# Patient Record
Sex: Female | Born: 1965 | Race: Black or African American | Hispanic: No | Marital: Married | Smoking: Never smoker
Health system: Southern US, Community
[De-identification: ages and names within clinical notes are randomized; demographics above are authoritative.]

## PROBLEM LIST (undated history)

## (undated) DIAGNOSIS — I10 Essential (primary) hypertension: Secondary | ICD-10-CM

## (undated) DIAGNOSIS — R87619 Unspecified abnormal cytological findings in specimens from cervix uteri: Secondary | ICD-10-CM

## (undated) DIAGNOSIS — B977 Papillomavirus as the cause of diseases classified elsewhere: Secondary | ICD-10-CM

## (undated) HISTORY — DX: Papillomavirus as the cause of diseases classified elsewhere: B97.7

## (undated) HISTORY — DX: Essential (primary) hypertension: I10

## (undated) HISTORY — DX: Unspecified abnormal cytological findings in specimens from cervix uteri: R87.619

## (undated) HISTORY — PX: COLPOSCOPY: SHX161

---

## 2005-10-05 ENCOUNTER — Ambulatory Visit: Payer: Self-pay | Admitting: Unknown Physician Specialty

## 2005-10-23 ENCOUNTER — Ambulatory Visit: Payer: Self-pay | Admitting: Unknown Physician Specialty

## 2008-01-01 ENCOUNTER — Ambulatory Visit: Payer: Self-pay | Admitting: Family Medicine

## 2011-03-03 ENCOUNTER — Emergency Department: Payer: Self-pay

## 2014-06-29 ENCOUNTER — Emergency Department: Payer: Self-pay | Admitting: Emergency Medicine

## 2016-03-16 ENCOUNTER — Other Ambulatory Visit: Payer: Self-pay | Admitting: Physician Assistant

## 2016-03-16 DIAGNOSIS — Z1231 Encounter for screening mammogram for malignant neoplasm of breast: Secondary | ICD-10-CM

## 2016-04-10 ENCOUNTER — Ambulatory Visit
Admission: RE | Admit: 2016-04-10 | Discharge: 2016-04-10 | Disposition: A | Discharge status: Home / Self Care | Payer: BLUE CROSS/BLUE SHIELD | Source: Ambulatory Visit | Attending: Physician Assistant | Admitting: Physician Assistant

## 2016-04-10 ENCOUNTER — Encounter: Payer: Self-pay | Admitting: Radiology

## 2016-04-10 DIAGNOSIS — Z1231 Encounter for screening mammogram for malignant neoplasm of breast: Secondary | ICD-10-CM | POA: Insufficient documentation

## 2016-11-06 ENCOUNTER — Encounter: Payer: Self-pay | Admitting: Obstetrics and Gynecology

## 2016-11-06 ENCOUNTER — Ambulatory Visit (INDEPENDENT_AMBULATORY_CARE_PROVIDER_SITE_OTHER): Payer: BLUE CROSS/BLUE SHIELD | Admitting: Obstetrics and Gynecology

## 2016-11-06 VITALS — BP 132/82 | HR 91 | Ht 59.0 in | Wt 214.0 lb

## 2016-11-06 DIAGNOSIS — R87810 Cervical high risk human papillomavirus (HPV) DNA test positive: Secondary | ICD-10-CM | POA: Diagnosis not present

## 2016-11-06 DIAGNOSIS — R87610 Atypical squamous cells of undetermined significance on cytologic smear of cervix (ASC-US): Secondary | ICD-10-CM | POA: Diagnosis not present

## 2016-11-06 NOTE — Patient Instructions (Signed)

## 2016-11-06 NOTE — Progress Notes (Signed)
GYNECOLOGY CLINIC COLPOSCOPY PROCEDURE NOTE  51 y.o. No obstetric history on file. here for colposcopy for ASCUS HPV positive pap smear on 08/2016. Discussed underlying role for HPV infection in the development of cervical dysplasia, its natural history and progression/regression, need for surveillance.  Is the patient  pregnant: No LMP: Patient's last menstrual period was 10/23/2016 (exact date). Smoking status:  Metrics: Intervention Frequency ACO  Documented Smoking Status Yearly  Screened one or more times in 24 months  Cessation Counseling or  Active cessation medication Past 24 months  Past 24 months   Guideline developer: UpToDate (See UpToDate for funding source) Date Released: 2014   Contraception: ParaGard IUD Future fertility desired:  No.  Patient given informed consent, signed copy in the chart, time out was performed.  The patient was position in dorsal lithotomy position. Speculum was placed the cervix was visualized.   After application of acetic acid colposcopic inspection of the cervix was undertaken.   Colposcopy adequate, full visualization of transformation zone: Yes no visible lesions; corresponding biopsies obtained.   ECC specimen obtained:  Yes All specimens were labeled and sent to pathology.   Patient was given post procedure instructions.  Will follow up pathology and manage accordingly.  Routine preventative health maintenance measures emphasized.  Physical Exam  Genitourinary:      Vena Austria, MD, [facility], [facility]

## 2016-11-08 LAB — PATHOLOGY

## 2016-11-09 ENCOUNTER — Telehealth: Payer: Self-pay | Admitting: Obstetrics and Gynecology

## 2016-11-20 NOTE — Telephone Encounter (Signed)
Pt called to get her Colpo results. Please advise. Thanks TNP

## 2016-11-20 NOTE — Telephone Encounter (Signed)
Please call patient with results.

## 2016-11-20 NOTE — Telephone Encounter (Signed)
Pt is calling back about her lab result.

## 2018-05-01 ENCOUNTER — Other Ambulatory Visit: Payer: Self-pay | Admitting: Family Medicine

## 2018-05-01 DIAGNOSIS — Z1231 Encounter for screening mammogram for malignant neoplasm of breast: Secondary | ICD-10-CM

## 2018-07-08 ENCOUNTER — Ambulatory Visit
Admission: RE | Admit: 2018-07-08 | Discharge: 2018-07-08 | Disposition: A | Discharge status: Home / Self Care | Payer: BLUE CROSS/BLUE SHIELD | Source: Ambulatory Visit | Attending: Family Medicine | Admitting: Family Medicine

## 2018-07-08 DIAGNOSIS — Z1231 Encounter for screening mammogram for malignant neoplasm of breast: Secondary | ICD-10-CM

## 2018-07-23 ENCOUNTER — Telehealth: Payer: Self-pay | Admitting: Obstetrics & Gynecology

## 2018-07-23 NOTE — Telephone Encounter (Signed)
[facility] for Colposcopy HPV+. Called and left voicemail for patient to call back to be schedule

## 2018-08-19 ENCOUNTER — Other Ambulatory Visit (HOSPITAL_COMMUNITY)
Admission: RE | Admit: 2018-08-19 | Discharge: 2018-08-19 | Disposition: A | Discharge status: Home / Self Care | Payer: BLUE CROSS/BLUE SHIELD | Source: Ambulatory Visit | Attending: Obstetrics and Gynecology | Admitting: Obstetrics and Gynecology

## 2018-08-19 ENCOUNTER — Encounter: Payer: Self-pay | Admitting: Obstetrics and Gynecology

## 2018-08-19 ENCOUNTER — Ambulatory Visit (INDEPENDENT_AMBULATORY_CARE_PROVIDER_SITE_OTHER): Payer: BLUE CROSS/BLUE SHIELD | Admitting: Obstetrics and Gynecology

## 2018-08-19 VITALS — BP 128/88 | Ht 59.0 in | Wt 221.0 lb

## 2018-08-19 DIAGNOSIS — R8789 Other abnormal findings in specimens from female genital organs: Secondary | ICD-10-CM

## 2018-08-19 DIAGNOSIS — N870 Mild cervical dysplasia: Secondary | ICD-10-CM

## 2018-08-19 DIAGNOSIS — R87810 Cervical high risk human papillomavirus (HPV) DNA test positive: Secondary | ICD-10-CM | POA: Insufficient documentation

## 2018-08-19 DIAGNOSIS — R87610 Atypical squamous cells of undetermined significance on cytologic smear of cervix (ASC-US): Secondary | ICD-10-CM | POA: Diagnosis present

## 2018-08-19 DIAGNOSIS — R87618 Other abnormal cytological findings on specimens from cervix uteri: Secondary | ICD-10-CM | POA: Insufficient documentation

## 2018-08-19 NOTE — Progress Notes (Signed)
Referring Provider:  Rexene Alberts, MD, [facility] for abnormal pap smear with HPV positive.  This is a persistent issue  HPI:  Annette Gill is a 53 y.o.  [phone]  who presents today for evaluation and management of abnormal cervical cytology.    Dysplasia History:   2018: ASCUS- HPV positive 2018: Colposcopy - normal 07/2018: NILM, HPV positive  OB History  Gravida Para Term Preterm AB Living  3 3 3     3   SAB TAB Ectopic Multiple Live Births          3    # Outcome Date GA Lbr Len/2nd Weight Sex Delivery Anes PTL Lv  3 Term           2 Term           1 Term             Past Medical History:  Diagnosis Date  . Abnormal Pap smear of cervix   . Human papilloma virus   . Hypertension     Past Surgical History:  Procedure Laterality Date  . COLPOSCOPY      SOCIAL HISTORY:  Social History   Substance and Sexual Activity  Alcohol Use Yes   Substance and Sexual Activity  Alcohol Use Yes     Substance and Sexual Activity  Drug Use No     History reviewed. No pertinent family history.  ALLERGIES:  Patient has no known allergies.  Current Outpatient Medications on File Prior to Visit  Medication Sig Dispense Refill  . levonorgestrel (MIRENA) 20 MCG/24HR IUD 1 each by Intrauterine route once.    Marland Kitchen lisinopril (PRINIVIL,ZESTRIL) 5 MG tablet   4   No current facility-administered medications on file prior to visit.     Physical Exam: -Vitals:  BP 128/88   Ht 4\' 11"  (1.499 m)   Wt 221 lb (100.2 kg)   LMP 07/28/2018   BMI 44.64 kg/m   GEN: WD, WN, NAD.  A+ O x 3, good mood and affect. ABD:  NT, ND.  Soft, no masses.  No hernias noted.   Pelvic:   Vulva: Normal appearance.  No lesions.  Vagina: No lesions or abnormalities noted.  Support: Normal pelvic support.  Urethra No masses tenderness or scarring.  Meatus Normal size without lesions or prolapse.  Cervix: See below.  Anus: Normal exam.  No lesions.  Perineum: Normal  exam.  No lesions.        Bimanual   Uterus: Normal size.  Non-tender.  Mobile.  AV.  Adnexae: No masses.  Non-tender to palpation.  Cul-de-sac: Negative for abnormality.   PROCEDURE: 1.  Urine Pregnancy Test:  not done 2.  Colposcopy performed with 4% acetic acid after verbal consent obtained                                         -Aceto-white Lesions Location(s): mildly diffusely               -Biopsy performed at 6 and 12 o'clock               -ECC indicated and performed: Yes.       -Biopsy sites made hemostatic with pressure, AgNO3, and/or Monsel's solution   -Satisfactory colposcopy: No.    -Evidence of Invasive cervical CA :  NO   -note: IUD strings present  and not interrupted during procedure  ASSESSMENT:  Annette Gill is a 53 y.o. [phone] here for  1. Pap smear abnormality of cervix/human papillomavirus (HPV) positive   2. ASCUS with positive high risk HPV cervical   .  PLAN:  I discussed the grading system of pap smears and HPV high risk viral types.  We will discuss and base management after colpo results return.      [name], MD  [facility], [facility] 08/19/2018  9:12 AM   CC: Antonieta Loveless, MD [address] [place], [place] [address]

## 2018-08-29 ENCOUNTER — Telehealth: Payer: Self-pay | Admitting: Obstetrics and Gynecology

## 2018-08-29 NOTE — Telephone Encounter (Signed)
Attempted to call.  Unable to leave VM.

## 2018-09-01 NOTE — Telephone Encounter (Signed)
Pt returned call 08/29/2018 at 5:47pm

## 2018-09-04 ENCOUNTER — Encounter: Payer: Self-pay | Admitting: Obstetrics and Gynecology

## 2018-09-04 NOTE — Telephone Encounter (Signed)
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 Recommendations per [facility] are to repeat pap smear with HPV in one year. I let her know that she could accomplish this at her PCP. However, if her results are abnormal again, we would have to repeat the procedure until she gets normal results.  All questions answered.   [name], MD, [facility], [facility] 09/04/2018 3:43 PM

## 2019-06-23 ENCOUNTER — Other Ambulatory Visit: Payer: Self-pay

## 2019-06-23 DIAGNOSIS — Z20822 Contact with and (suspected) exposure to covid-19: Secondary | ICD-10-CM

## 2019-06-24 LAB — NOVEL CORONAVIRUS, NAA: SARS-CoV-2, NAA: DETECTED — AB

## 2019-06-28 ENCOUNTER — Telehealth: Payer: Self-pay

## 2019-06-28 NOTE — Telephone Encounter (Signed)
Pt given Covid-19 positive results. Discussed mild, moderate and severe symptoms. Advised pt to call 911 for any respiratory issues and/dehydration. Discussed non test criteria for ending self isolation. Pt advised of way to manage symptoms at home and review isolation precautions especially the importance of washing hands frequently and wearing a mask when around others. Pt verbalized understanding.  

## 2019-11-05 ENCOUNTER — Other Ambulatory Visit: Payer: Self-pay | Admitting: Student

## 2019-11-05 DIAGNOSIS — Z1231 Encounter for screening mammogram for malignant neoplasm of breast: Secondary | ICD-10-CM

## 2019-11-17 ENCOUNTER — Ambulatory Visit
Admission: RE | Admit: 2019-11-17 | Discharge: 2019-11-17 | Disposition: A | Discharge status: Home / Self Care | Payer: BC Managed Care – PPO | Source: Ambulatory Visit | Attending: Obstetrics and Gynecology | Admitting: Obstetrics and Gynecology

## 2019-11-17 DIAGNOSIS — Z1231 Encounter for screening mammogram for malignant neoplasm of breast: Secondary | ICD-10-CM | POA: Diagnosis present

## 2020-10-04 IMAGING — MG DIGITAL SCREENING BILATERAL MAMMOGRAM WITH TOMO AND CAD
6 of 10 series · 6 of 30 positions shown · non-contrast
Comparison: Previous exam(s).

CLINICAL DATA: Screening.

EXAM:
DIGITAL SCREENING BILATERAL MAMMOGRAM WITH TOMO AND CAD

[R CC synth-2D]
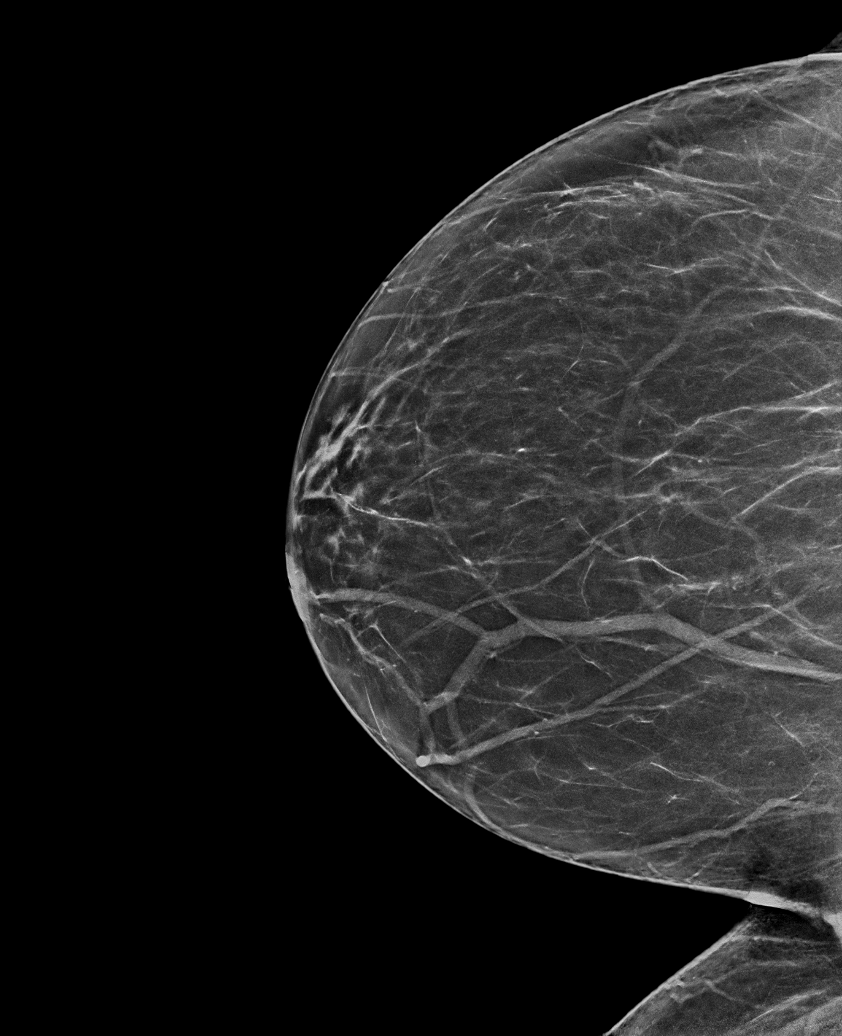

[R MLO synth-2D (1 of 2)]
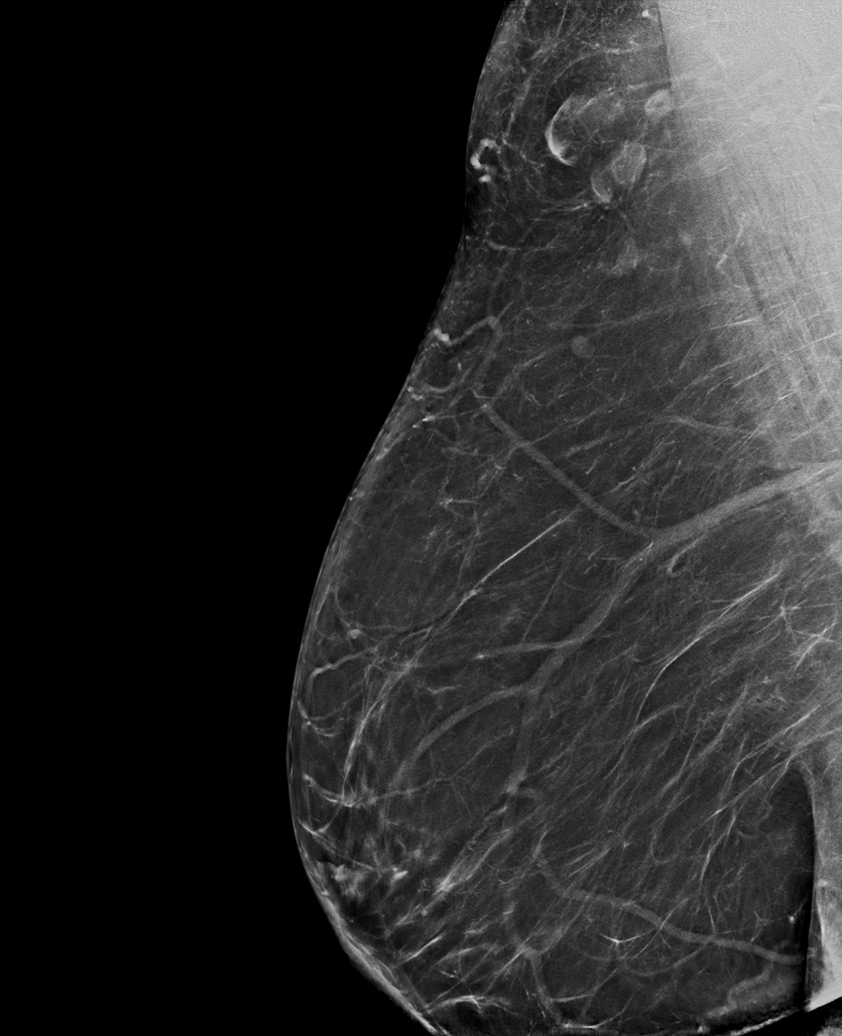

[L CC synth-2D]
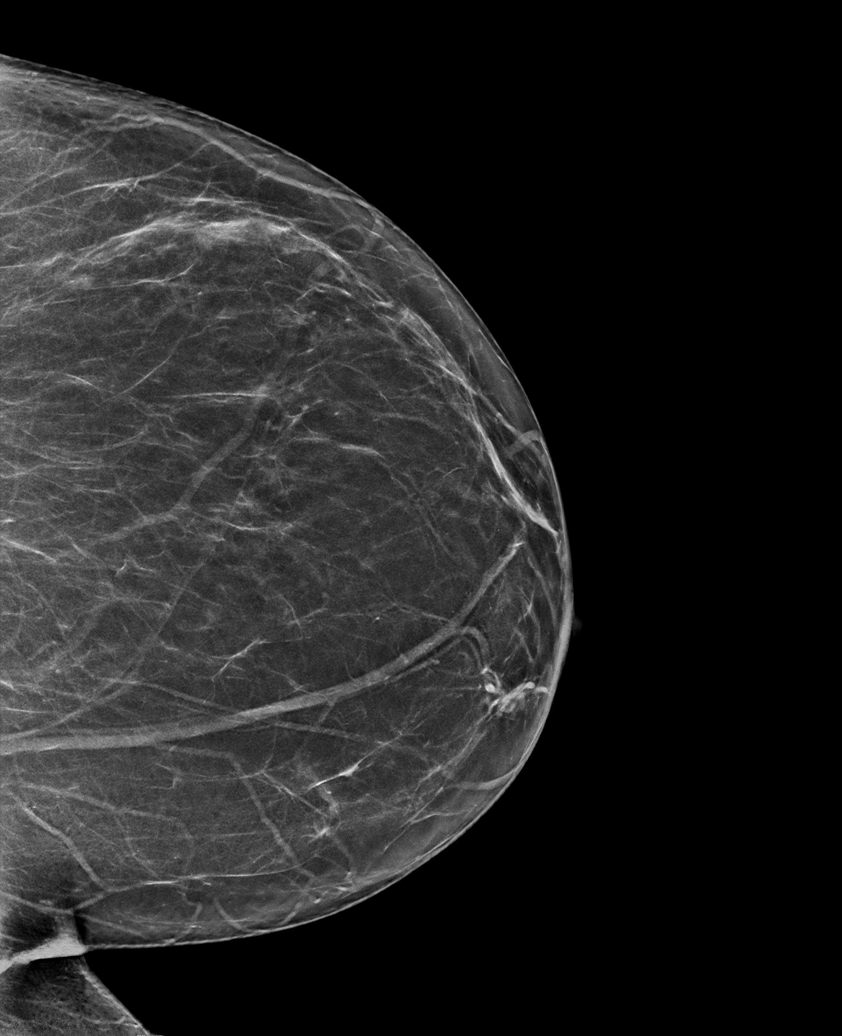

[L MLO synth-2D]
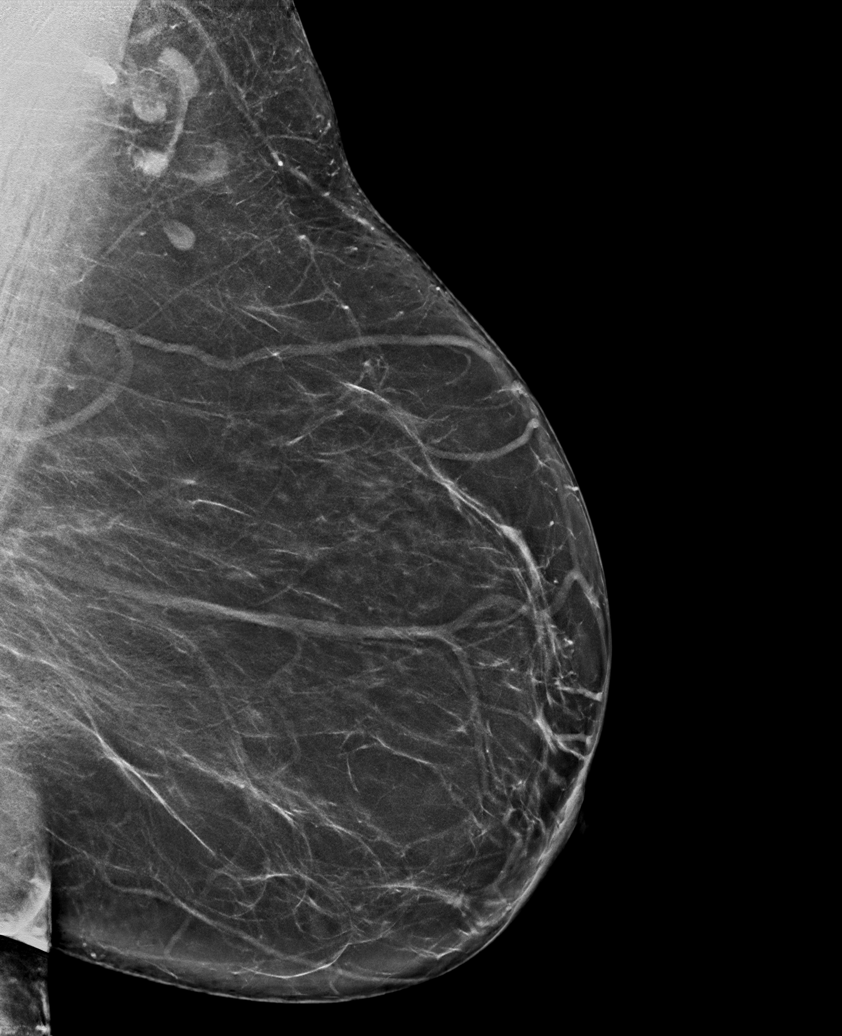

[R MLO synth-2D (2 of 2)]
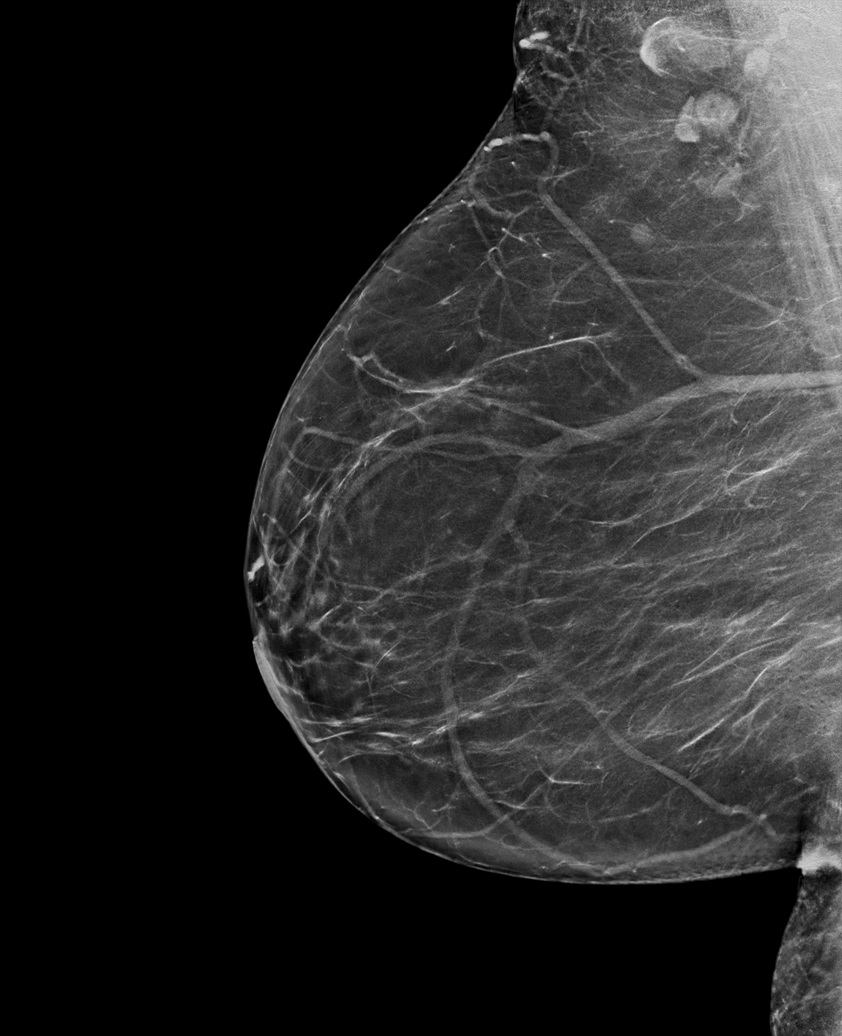

[L MLO tomo · tomo slice 41/82.0]
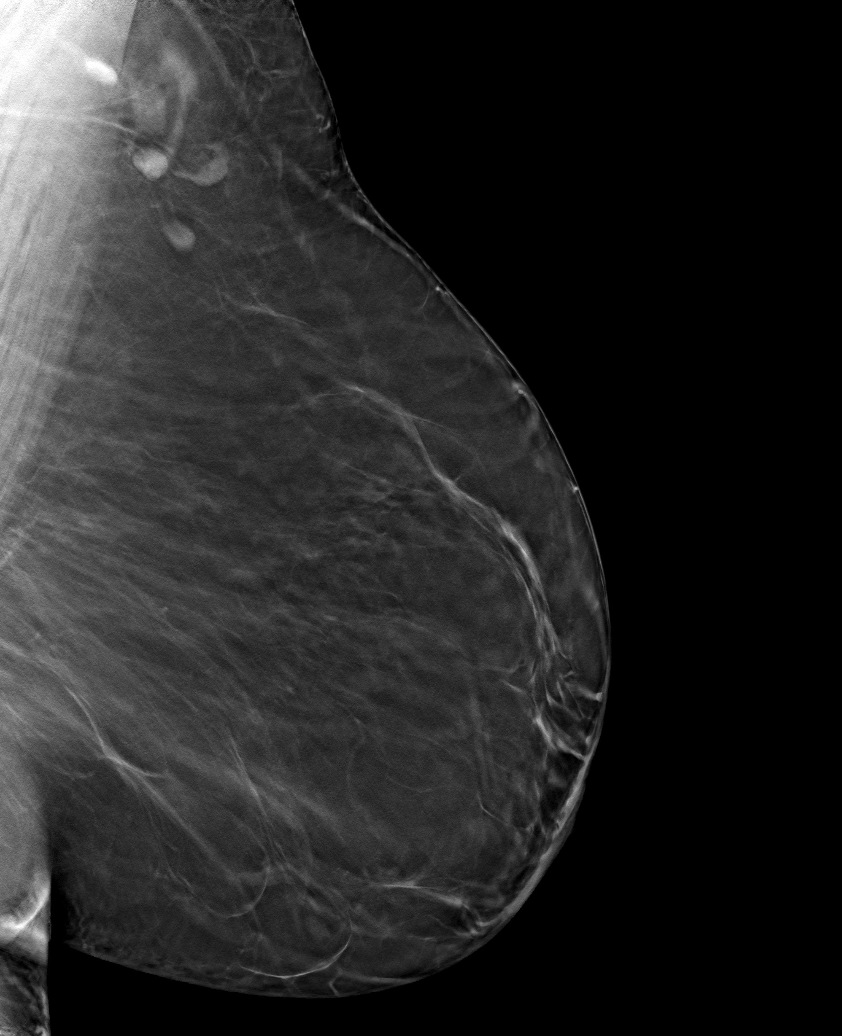

[6 of 30 positions shown; findings below may reference images not displayed]

ACR Breast Density Category b: There are scattered areas of
fibroglandular density.
FINDINGS: There are no findings suspicious for malignancy. Images were
processed with CAD.
IMPRESSION: No mammographic evidence of malignancy. A result letter of this
screening mammogram will be mailed directly to the patient.

RECOMMENDATION:
Screening mammogram in one year. (Code:[id number])

BI-RADS CATEGORY  1: Negative.

## 2022-04-13 ENCOUNTER — Other Ambulatory Visit: Payer: Self-pay | Admitting: Student

## 2022-04-13 DIAGNOSIS — Z1231 Encounter for screening mammogram for malignant neoplasm of breast: Secondary | ICD-10-CM

## 2022-05-17 ENCOUNTER — Ambulatory Visit
Admission: RE | Admit: 2022-05-17 | Discharge: 2022-05-17 | Disposition: A | Discharge status: Home / Self Care | Payer: BC Managed Care – PPO | Source: Ambulatory Visit | Attending: Student | Admitting: Student

## 2022-05-17 DIAGNOSIS — Z1231 Encounter for screening mammogram for malignant neoplasm of breast: Secondary | ICD-10-CM | POA: Diagnosis present

## 2022-11-29 ENCOUNTER — Other Ambulatory Visit: Payer: Self-pay | Admitting: Student

## 2022-11-29 DIAGNOSIS — N950 Postmenopausal bleeding: Secondary | ICD-10-CM

## 2023-05-01 ENCOUNTER — Emergency Department
Admission: EM | Admit: 2023-05-01 | Discharge: 2023-05-01 | Disposition: A | Discharge status: Home / Self Care | Payer: BC Managed Care – PPO | Attending: Emergency Medicine | Admitting: Emergency Medicine

## 2023-05-01 ENCOUNTER — Encounter: Payer: Self-pay | Admitting: *Deleted

## 2023-05-01 ENCOUNTER — Other Ambulatory Visit: Payer: Self-pay

## 2023-05-01 DIAGNOSIS — W19XXXA Unspecified fall, initial encounter: Secondary | ICD-10-CM | POA: Diagnosis not present

## 2023-05-01 DIAGNOSIS — M25511 Pain in right shoulder: Secondary | ICD-10-CM | POA: Diagnosis present

## 2023-05-01 DIAGNOSIS — S46911A Strain of unspecified muscle, fascia and tendon at shoulder and upper arm level, right arm, initial encounter: Secondary | ICD-10-CM | POA: Insufficient documentation

## 2023-05-01 MED ORDER — NAPROXEN 500 MG PO TABS: 500.0000 mg | ORAL_TABLET | Freq: Two times a day (BID) | ORAL | 2 refills | Status: AC

## 2023-05-01 NOTE — ED Notes (Signed)
See triage note  Presents with right shoulder/arm pain   States pain radiates from her neck  States she also fell about 2 weeks ago  Unsure if this pain is related  No deformity noted Good pulses

## 2023-05-01 NOTE — ED Triage Notes (Signed)
Pt tripped and fell getting out to the car two weeks ago and struck her right side.  Pt has had right sided pain and would like to be evaluated. Pt is ambulatory to triage, she is sore.

## 2023-05-02 NOTE — ED Provider Notes (Signed)
   [facility] Provider Note    Event Date/Time   First MD Initiated Contact with Patient 05/01/23 [phone]     (approximate)   History   Fall   HPI  Annette Gill is a 57 y.o. female who presents with complaints of right-sided shoulder pain for nearly 2 weeks now.  She injured her shoulder after a fall and she reports it is improved but it is not entirely better, still has pain with full abduction     Physical Exam   Triage Vital Signs: ED Triage Vitals  Encounter Vitals Group     BP 05/01/23 0910 (!) 160/88     Systolic BP Percentile --      Diastolic BP Percentile --      Pulse Rate 05/01/23 0910 100     Resp 05/01/23 0910 15     Temp 05/01/23 0910 98.4 F (36.9 C)     Temp Source 05/01/23 0910 Oral     SpO2 05/01/23 0910 98 %     Weight 05/01/23 0925 100.2 kg (220 lb 14.4 oz)     Height 05/01/23 0925 1.499 m (4\' 11" )     Head Circumference --      Peak Flow --      Pain Score 05/01/23 0911 7     Pain Loc --      Pain Education --      Exclude from Growth Chart --     Most recent vital signs: Vitals:   05/01/23 0910  BP: (!) 160/88  Pulse: 100  Resp: 15  Temp: 98.4 F (36.9 C)  SpO2: 98%     General: Awake, no distress.  CV:  Good peripheral perfusion.  Resp:  Normal effort.  Abd:  No distention.  Other:  Patient is able to range her right arm well on her own, she is able to abduct past 90 degrees although it is somewhat uncomfortable.  Normal strength in extremity, warm and well-perfused   ED Results / Procedures / Treatments   Labs (all labs ordered are listed, but only abnormal results are displayed) Labs Reviewed - No data to display   EKG     RADIOLOGY     PROCEDURES:  Critical Care performed:   Procedures   MEDICATIONS ORDERED IN ED: Medications - No data to display   IMPRESSION / MDM / ASSESSMENT AND PLAN / ED COURSE  I reviewed the triage vital signs and the nursing notes. Patient's  presentation is most consistent with acute, uncomplicated illness.  Patient's exam is most consistent with shoulder strain versus tendinitis, recommend supportive care, NSAIDs, outpatient follow-up to orthopedics as needed        FINAL CLINICAL IMPRESSION(S) / ED DIAGNOSES   Final diagnoses:  Shoulder strain, right, initial encounter     Rx / DC Orders   ED Discharge Orders          Ordered    naproxen (NAPROSYN) 500 MG tablet  2 times daily with meals        05/01/23 0945             Note:  This document was prepared using Dragon voice recognition software and may include unintentional dictation errors.   Jene Every, MD 05/02/23 1144

## 2024-04-21 ENCOUNTER — Other Ambulatory Visit: Payer: Self-pay

## 2024-04-21 DIAGNOSIS — T783XXA Angioneurotic edema, initial encounter: Secondary | ICD-10-CM | POA: Insufficient documentation

## 2024-04-21 DIAGNOSIS — I10 Essential (primary) hypertension: Secondary | ICD-10-CM | POA: Insufficient documentation

## 2024-04-21 DIAGNOSIS — Z79899 Other long term (current) drug therapy: Secondary | ICD-10-CM | POA: Insufficient documentation

## 2024-04-21 DIAGNOSIS — R220 Localized swelling, mass and lump, head: Secondary | ICD-10-CM | POA: Diagnosis present

## 2024-04-21 NOTE — ED Triage Notes (Addendum)
 Pt reports the right side of her tongue began swelling tonight, pt takes lisinopril. Pt reports no hx of same. Pt reports some throat swelling, muffled voice.

## 2024-04-22 ENCOUNTER — Emergency Department
Admission: EM | Admit: 2024-04-22 | Discharge: 2024-04-22 | Disposition: A | Discharge status: Home / Self Care | Attending: Emergency Medicine | Admitting: Emergency Medicine

## 2024-04-22 DIAGNOSIS — T783XXA Angioneurotic edema, initial encounter: Secondary | ICD-10-CM

## 2024-04-22 MED ORDER — TRANEXAMIC ACID-NACL 1000-0.7 MG/100ML-% IV SOLN
1000.0000 mg | Freq: Once | INTRAVENOUS | Status: AC
  Administered 2024-04-22: 1000 mg via INTRAVENOUS
  Filled 2024-04-22: qty 100

## 2024-04-22 MED ORDER — FAMOTIDINE IN NACL 20-0.9 MG/50ML-% IV SOLN
20.0000 mg | Freq: Once | INTRAVENOUS | Status: AC
  Administered 2024-04-22: 20 mg via INTRAVENOUS
  Filled 2024-04-22: qty 50

## 2024-04-22 MED ORDER — DIPHENHYDRAMINE HCL 50 MG/ML IJ SOLN
50.0000 mg | Freq: Once | INTRAMUSCULAR | Status: AC
  Administered 2024-04-22: 50 mg via INTRAVENOUS
  Filled 2024-04-22: qty 1

## 2024-04-22 MED ORDER — EPINEPHRINE 0.3 MG/0.3ML IJ SOAJ
INTRAMUSCULAR | Status: AC
  Administered 2024-04-22: 0.3 mg
  Filled 2024-04-22: qty 0.3

## 2024-04-22 MED ORDER — PREDNISONE 20 MG PO TABS: 40.0000 mg | ORAL_TABLET | Freq: Every day | ORAL | 0 refills | Status: AC

## 2024-04-22 MED ORDER — METHYLPREDNISOLONE SODIUM SUCC 125 MG IJ SOLR
125.0000 mg | Freq: Once | INTRAMUSCULAR | Status: AC
  Administered 2024-04-22: 125 mg via INTRAVENOUS
  Filled 2024-04-22: qty 2

## 2024-04-22 NOTE — ED Notes (Signed)
 Pt hooked back up to cardiac monitor after being taken off to use the bathroom.

## 2024-04-22 NOTE — ED Provider Notes (Addendum)
 [facility] Provider Note    Event Date/Time   First MD Initiated Contact with Patient 04/22/24 0002     (approximate)   History   Oral Swelling   HPI  Annette Gill is a 58 y.o. female   Past medical history of hypertension on lisinopril presents to the Emergency Department with tongue swelling.  Happened after dinner around 6 PM tonight.  Right side of tongue.  Got worse over the last couple hours and have stabilized for the last 1 to 2 hours.  Muffled voice but breathing comfortably.  Has never happened to her before.  No obvious exposures or allergic conditions.  No family history of the same.  Has been on lisinopril for years but recently upped the dose.  Independent Historian contributed to assessment above: Husband at bedside corroborates information above    Physical Exam   Triage Vital Signs: ED Triage Vitals  Encounter Vitals Group     BP 04/21/24 2359 (!) 179/96     Girls Systolic BP Percentile --      Girls Diastolic BP Percentile --      Boys Systolic BP Percentile --      Boys Diastolic BP Percentile --      Pulse Rate 04/21/24 2359 (!) 106     Resp 04/21/24 2359 18     Temp 04/21/24 2359 98.1 F (36.7 C)     Temp Source 04/21/24 2359 Oral     SpO2 04/21/24 2359 97 %     Weight 04/21/24 2358 185 lb (83.9 kg)     Height 04/21/24 2358 4' 11 (1.499 m)     Head Circumference --      Peak Flow --      Pain Score 04/21/24 2358 8     Pain Loc --      Pain Education --      Exclude from Growth Chart --     Most recent vital signs: Vitals:   04/22/24 0100 04/22/24 0200  BP: (!) 148/91 139/82  Pulse: (!) 113 (!) 105  Resp: (!) 32 17  Temp:    SpO2: 98% 93%    General: Awake, no distress.  CV:  Good peripheral perfusion.  Resp:  Normal effort.  Abd:  No distention.  Other:  Muffled voice and swollen anterior to mid tongue on the right side.  Lips look normal.  Posterior oropharynx visible uvula midline no swelling  posteriorly.  No skin rashes.  Breathing comfortably.  Soft abdomen nontender   ED Results / Procedures / Treatments   Labs (all labs ordered are listed, but only abnormal results are displayed) Labs Reviewed - No data to display  PROCEDURES:  Critical Care performed: Yes, see critical care procedure note(s)  .Critical Care  Performed by: Cyrena Mylar, MD Authorized by: Cyrena Mylar, MD   Critical care provider statement:    Critical care time (minutes):  30   Critical care was time spent personally by me on the following activities:  Development of treatment plan with patient or surrogate, discussions with consultants, evaluation of patient's response to treatment, examination of patient, ordering and review of laboratory studies, ordering and review of radiographic studies, ordering and performing treatments and interventions, pulse oximetry, re-evaluation of patient's condition and review of old charts    MEDICATIONS ORDERED IN ED: Medications  EPINEPHrine  (EPI-PEN) 0.3 mg/0.3 mL injection (0.3 mg  Given 04/22/24 0009)  methylPREDNISolone  sodium succinate (SOLU-MEDROL ) 125 mg/2 mL injection 125 mg (125  mg Intravenous Given 04/22/24 0015)  famotidine  (PEPCID ) IVPB 20 mg premix (0 mg Intravenous Stopped 04/22/24 0045)  diphenhydrAMINE  (BENADRYL ) injection 50 mg (50 mg Intravenous Given 04/22/24 0015)  tranexamic acid  (CYKLOKAPRON ) IVPB 1,000 mg (0 mg Intravenous Stopped 04/22/24 0056)     IMPRESSION / MDM / ASSESSMENT AND PLAN / ED COURSE  I reviewed the triage vital signs and the nursing notes.                                Patient's presentation is most consistent with acute presentation with potential threat to life or bodily function.  Differential diagnosis includes, but is not limited to, tributary versus ACE inhibitor induced angioedema, airway obstruction, allergic reaction   The patient is on the cardiac monitor to evaluate for evidence of arrhythmia and/or  significant heart rate changes.  MDM: Most likely ACE inhibitor induced angioedema fortunately only affecting the right side of her tongue mostly anterior with no airway obstruction at this time and has already started to stabilize over the last 1 to 2 hours at home.  Will give the full course of anaphylaxis treatment, TXA, and observe in the emergency department for disposition.  I let her know never to take ACE inhibitor medications again, discontinue her lisinopril completely and have her primary doctor reevaluate for alternative blood pressure medications.    -- 3 hours observation (coming up on 6 hours total since the peak of symptoms, stabilization, and improvement ) in the emergency department now with much improvement, phonation now nearly normalized and there is some very small residual swelling on the right side of the tongue only, though much improved compared to prior.  We talked about further observation or admission for full resolution, however given the marked improvement she is comfortable with going home at this time and knows to come back right away should symptoms begin to worsen at all.     FINAL CLINICAL IMPRESSION(S) / ED DIAGNOSES   Final diagnoses:  Angioedema, initial encounter     Rx / DC Orders   ED Discharge Orders          Ordered    predniSONE  (DELTASONE ) 20 MG tablet  Daily        04/22/24 0259             Note:  This document was prepared using Dragon voice recognition software and may include unintentional dictation errors.    Cyrena Mylar, MD 04/22/24 9980    Cyrena Mylar, MD 04/22/24 9698    Cyrena Mylar, MD 04/22/24 9684    Cyrena Mylar, MD 04/22/24 [phone]

## 2024-04-22 NOTE — Discharge Instructions (Addendum)
 Never take the lisinopril or any of the ACE inhibitor blood pressure medications again as these are likely related to the swelling in your tongue that you experienced tonight.  See your doctor to talk about alternative blood pressure medications.  Take prednisone  for 3 days as prescribed.   Thank you for choosing us  for your health care today!  Please see your primary doctor this week for a follow up appointment.   If you have any new, worsening, or unexpected symptoms call your doctor right away or come back to the emergency department for reevaluation.  It was my pleasure to care for you today.   Ginnie EDISON Cyrena, MD

## 2024-05-28 ENCOUNTER — Other Ambulatory Visit: Payer: Self-pay | Admitting: Student

## 2024-05-28 DIAGNOSIS — Z1231 Encounter for screening mammogram for malignant neoplasm of breast: Secondary | ICD-10-CM

## 2024-07-16 ENCOUNTER — Ambulatory Visit
Admission: RE | Admit: 2024-07-16 | Discharge: 2024-07-16 | Disposition: A | Discharge status: Home / Self Care | Source: Ambulatory Visit | Attending: Student | Admitting: Student

## 2024-07-16 DIAGNOSIS — Z1231 Encounter for screening mammogram for malignant neoplasm of breast: Secondary | ICD-10-CM | POA: Diagnosis present
